# Patient Record
Sex: Male | Born: 1955 | Race: Black or African American | Hispanic: No | Marital: Single | Smoking: Current every day smoker
Health system: Southern US, Community
[De-identification: ages and names within clinical notes are randomized; demographics above are authoritative.]

## PROBLEM LIST (undated history)

## (undated) DIAGNOSIS — B20 Human immunodeficiency virus [HIV] disease: Secondary | ICD-10-CM

## (undated) DIAGNOSIS — E119 Type 2 diabetes mellitus without complications: Secondary | ICD-10-CM

## (undated) DIAGNOSIS — B1910 Unspecified viral hepatitis B without hepatic coma: Secondary | ICD-10-CM

## (undated) HISTORY — PX: APPENDECTOMY: SHX54

---

## 2000-06-19 ENCOUNTER — Encounter: Payer: Self-pay | Admitting: Emergency Medicine

## 2000-06-19 ENCOUNTER — Emergency Department (HOSPITAL_COMMUNITY): Admission: EM | Admit: 2000-06-19 | Discharge: 2000-06-19 | Payer: Self-pay | Admitting: Emergency Medicine

## 2000-07-13 ENCOUNTER — Encounter: Admission: RE | Admit: 2000-07-13 | Discharge: 2000-07-13 | Payer: Self-pay | Admitting: Internal Medicine

## 2001-07-18 ENCOUNTER — Other Ambulatory Visit: Admission: RE | Admit: 2001-07-18 | Discharge: 2001-07-18 | Payer: Self-pay | Admitting: *Deleted

## 2001-10-02 ENCOUNTER — Other Ambulatory Visit: Admission: RE | Admit: 2001-10-02 | Discharge: 2001-10-02 | Payer: Self-pay | Admitting: *Deleted

## 2002-01-04 ENCOUNTER — Emergency Department (HOSPITAL_COMMUNITY): Admission: EM | Admit: 2002-01-04 | Discharge: 2002-01-04 | Payer: Self-pay

## 2014-01-24 ENCOUNTER — Emergency Department (HOSPITAL_BASED_OUTPATIENT_CLINIC_OR_DEPARTMENT_OTHER): Payer: Self-pay

## 2014-01-24 ENCOUNTER — Emergency Department (HOSPITAL_BASED_OUTPATIENT_CLINIC_OR_DEPARTMENT_OTHER)
Admission: EM | Admit: 2014-01-24 | Discharge: 2014-01-24 | Disposition: A | Discharge status: Home / Self Care | Payer: Self-pay | Attending: Emergency Medicine | Admitting: Emergency Medicine

## 2014-01-24 ENCOUNTER — Encounter (HOSPITAL_BASED_OUTPATIENT_CLINIC_OR_DEPARTMENT_OTHER): Payer: Self-pay | Admitting: Emergency Medicine

## 2014-01-24 DIAGNOSIS — R109 Unspecified abdominal pain: Secondary | ICD-10-CM | POA: Insufficient documentation

## 2014-01-24 DIAGNOSIS — Z8619 Personal history of other infectious and parasitic diseases: Secondary | ICD-10-CM | POA: Insufficient documentation

## 2014-01-24 DIAGNOSIS — R05 Cough: Secondary | ICD-10-CM | POA: Insufficient documentation

## 2014-01-24 DIAGNOSIS — Z880 Allergy status to penicillin: Secondary | ICD-10-CM | POA: Insufficient documentation

## 2014-01-24 DIAGNOSIS — Z720 Tobacco use: Secondary | ICD-10-CM | POA: Insufficient documentation

## 2014-01-24 DIAGNOSIS — R059 Cough, unspecified: Secondary | ICD-10-CM

## 2014-01-24 DIAGNOSIS — Z21 Asymptomatic human immunodeficiency virus [HIV] infection status: Secondary | ICD-10-CM | POA: Insufficient documentation

## 2014-01-24 DIAGNOSIS — Z79899 Other long term (current) drug therapy: Secondary | ICD-10-CM | POA: Insufficient documentation

## 2014-01-24 DIAGNOSIS — E119 Type 2 diabetes mellitus without complications: Secondary | ICD-10-CM | POA: Insufficient documentation

## 2014-01-24 DIAGNOSIS — R0602 Shortness of breath: Secondary | ICD-10-CM | POA: Insufficient documentation

## 2014-01-24 DIAGNOSIS — Z7952 Long term (current) use of systemic steroids: Secondary | ICD-10-CM | POA: Insufficient documentation

## 2014-01-24 HISTORY — DX: Human immunodeficiency virus (HIV) disease: B20

## 2014-01-24 HISTORY — DX: Unspecified viral hepatitis B without hepatic coma: B19.10

## 2014-01-24 HISTORY — DX: Type 2 diabetes mellitus without complications: E11.9

## 2014-01-24 LAB — CBC WITH DIFFERENTIAL/PLATELET
Basophils Absolute: 0 10*3/uL (ref 0.0–0.1)
Basophils Relative: 0 % (ref 0–1)
EOS ABS: 0 10*3/uL (ref 0.0–0.7)
Eosinophils Relative: 1 % (ref 0–5)
HCT: 41.2 % (ref 39.0–52.0)
Hemoglobin: 14.9 g/dL (ref 13.0–17.0)
LYMPHS ABS: 2.4 10*3/uL (ref 0.7–4.0)
Lymphocytes Relative: 52 % — ABNORMAL HIGH (ref 12–46)
MCH: 30.7 pg (ref 26.0–34.0)
MCHC: 36.2 g/dL — AB (ref 30.0–36.0)
MCV: 84.8 fL (ref 78.0–100.0)
Monocytes Absolute: 0.4 10*3/uL (ref 0.1–1.0)
Monocytes Relative: 9 % (ref 3–12)
NEUTROS ABS: 1.7 10*3/uL (ref 1.7–7.7)
Neutrophils Relative %: 38 % — ABNORMAL LOW (ref 43–77)
Platelets: 122 10*3/uL — ABNORMAL LOW (ref 150–400)
RBC: 4.86 MIL/uL (ref 4.22–5.81)
RDW: 12.5 % (ref 11.5–15.5)
WBC: 4.5 10*3/uL (ref 4.0–10.5)

## 2014-01-24 LAB — URINALYSIS, ROUTINE W REFLEX MICROSCOPIC
Bilirubin Urine: NEGATIVE
GLUCOSE, UA: 100 mg/dL — AB
Ketones, ur: NEGATIVE mg/dL
Leukocytes, UA: NEGATIVE
Nitrite: NEGATIVE
Specific Gravity, Urine: 1.02 (ref 1.005–1.030)
Urobilinogen, UA: 0.2 mg/dL (ref 0.0–1.0)
pH: 5 (ref 5.0–8.0)

## 2014-01-24 LAB — COMPREHENSIVE METABOLIC PANEL
ALBUMIN: 3.5 g/dL (ref 3.5–5.2)
ALT: 61 U/L — AB (ref 0–53)
AST: 53 U/L — ABNORMAL HIGH (ref 0–37)
Alkaline Phosphatase: 69 U/L (ref 39–117)
Anion gap: 9 (ref 5–15)
BILIRUBIN TOTAL: 0.4 mg/dL (ref 0.3–1.2)
BUN: 13 mg/dL (ref 6–23)
CO2: 19 mmol/L (ref 19–32)
Calcium: 9.4 mg/dL (ref 8.4–10.5)
Chloride: 106 mEq/L (ref 96–112)
Creatinine, Ser: 0.96 mg/dL (ref 0.50–1.35)
GFR calc Af Amer: >90 mL/min (ref >90)
GLUCOSE: 180 mg/dL — AB (ref 70–99)
Potassium: 4.1 mmol/L (ref 3.5–5.1)
SODIUM: 134 mmol/L — AB (ref 135–145)
TOTAL PROTEIN: 8.7 g/dL — AB (ref 6.0–8.3)

## 2014-01-24 LAB — D-DIMER, QUANTITATIVE (NOT AT ARMC): D-Dimer, Quant: <0.27 ug/mL-FEU (ref 0.00–0.48)

## 2014-01-24 LAB — URINE MICROSCOPIC-ADD ON

## 2014-01-24 LAB — TROPONIN I: Troponin I: <0.03 ng/mL (ref <0.031)

## 2014-01-24 MED ORDER — IBUPROFEN 800 MG PO TABS: 800.0000 mg | ORAL_TABLET | Freq: Three times a day (TID) | ORAL | Status: AC | PRN

## 2014-01-24 MED ORDER — ONDANSETRON HCL 4 MG/2ML IJ SOLN
4.0000 mg | Freq: Once | INTRAMUSCULAR | Status: AC
  Administered 2014-01-24: 4 mg via INTRAVENOUS
  Filled 2014-01-24: qty 2

## 2014-01-24 MED ORDER — KETOROLAC TROMETHAMINE 30 MG/ML IJ SOLN
30.0000 mg | Freq: Once | INTRAMUSCULAR | Status: AC
  Administered 2014-01-24: 30 mg via INTRAVENOUS
  Filled 2014-01-24: qty 1

## 2014-01-24 MED ORDER — MORPHINE SULFATE 4 MG/ML IJ SOLN
4.0000 mg | Freq: Once | INTRAMUSCULAR | Status: AC
  Administered 2014-01-24: 4 mg via INTRAVENOUS
  Filled 2014-01-24: qty 1

## 2014-01-24 MED ORDER — SODIUM CHLORIDE 0.9 % IV BOLUS (SEPSIS)
1000.0000 mL | Freq: Once | INTRAVENOUS | Status: AC
  Administered 2014-01-24: 1000 mL via INTRAVENOUS

## 2014-01-24 MED ORDER — HYDROCODONE-ACETAMINOPHEN 5-325 MG PO TABS: 1.0000 | ORAL_TABLET | ORAL | Status: AC | PRN

## 2014-01-24 NOTE — ED Notes (Signed)
Pt presents to ED with complaints of right flank pain and shortness of breath  for 3 days.

## 2014-01-24 NOTE — ED Provider Notes (Signed)
TIME SEEN: 1:00 PM  CHIEF COMPLAINT: Right flank pain, shortness of breath  HPI: Pt is a 59 y.o. 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 He is also had some mild shortness of breath. He has had a chronic dry cough that is unchanged. No chest pain. No injury to the back. No numbness, tingling or focal weakness. No bowel or bladder incontinence. No history of kidney stone. No dysuria or hematuria. No prior history of PE or DVT. No fever. No abdominal pain. No vomiting or diarrhea.  ROS: See HPI Constitutional: no fever  Eyes: no drainage  ENT: no runny nose   Cardiovascular:  no chest pain  Resp: no SOB  GI: no vomiting GU: no dysuria Integumentary: no rash  Allergy: no hives  Musculoskeletal: no leg swelling  Neurological: no slurred speech ROS otherwise negative  PAST MEDICAL HISTORY/PAST SURGICAL HISTORY:  Past Medical History  Diagnosis Date  . HIV disease   . Diabetes mellitus without complication   . Hepatitis B     MEDICATIONS:  Prior to Admission medications   Medication Sig Start Date End Date Taking? Authorizing Provider  atazanavir (REYATAZ) 100 MG capsule Take 100 mg by mouth daily with breakfast.   Yes Historical Provider, MD  desonide (DESOWEN) 0.05 % cream Apply topically 2 (two) times daily.   Yes Historical Provider, MD  emtricitabine (EMTRIVA) 200 MG capsule Take 200 mg by mouth daily.   Yes Historical Provider, MD  lamoTRIgine (LAMICTAL) 100 MG tablet Take 100 mg by mouth daily.   Yes Historical Provider, MD  prazosin (MINIPRESS) 1 MG capsule Take 1 mg by mouth at bedtime.   Yes Historical Provider, MD  ritonavir (NORVIR) 100 MG capsule Take by mouth daily with breakfast.   Yes Historical Provider, MD  topiramate (TOPAMAX) 100 MG tablet Take 100 mg by mouth 2 (two) times daily.   Yes  Historical Provider, MD  traZODone (DESYREL) 150 MG tablet Take by mouth at bedtime.   Yes Historical Provider, MD  Vitamin D, Ergocalciferol, (DRISDOL) 50000 UNITS CAPS capsule Take 50,000 Units by mouth every 7 (seven) days.   Yes Historical Provider, MD    ALLERGIES:  Allergies  Allergen Reactions  . Codeine   . Erythromycin   . Penicillins     SOCIAL HISTORY:  History  Substance Use Topics  . Smoking status: Current Every Day Smoker  . Smokeless tobacco: Not on file  . Alcohol Use: Not on file    FAMILY HISTORY: No family history on file.  EXAM: BP 136/76 mmHg  Pulse 85  Temp(Src) 98.2 F (36.8 C) (Oral)  Resp 20  Ht 5\' 8"  (1.727 m)  Wt 224 lb (101.606 kg)  BMI 34.07 kg/m2  SpO2 100% CONSTITUTIONAL: Alert and oriented and responds appropriately to questions. Well-appearing; well-nourished HEAD: Normocephalic EYES: Conjunctivae clear, PERRL ENT: normal nose; no rhinorrhea; moist mucous membranes; pharynx without lesions noted NECK: Supple, no meningismus, no LAD  CARD: RRR; S1 and S2 appreciated; no murmurs, no clicks, no rubs, no gallops RESP: Normal chest excursion without splinting or tachypnea; breath sounds clear and equal bilaterally; no wheezes, no rhonchi, no rales, no hypoxia ABD/GI: Normal bowel sounds; non-distended; soft, non-tender, no rebound, no guarding BACK:  The back appears normal and is tender to palpation over the right flank with just minimal palpation,  there are no lesions noted over this area EXT: Normal ROM in all joints; non-tender to palpation; no edema; normal capillary refill; no cyanosis    SKIN: Normal color for age and race; warm NEURO: Moves all extremities equally, sensation to light touch intact diffusely, cranial nerves II through XII intact, normal gait PSYCH: The patient's mood and manner are appropriate. Grooming and personal hygiene are appropriate.  MEDICAL DECISION MAKING: Patient here with right flank pain that may be  musculoskeletal in nature. Differential diagnosis also includes kidney stone, pyelonephritis, pulmonary embolus, less likely cholecystitis or cholelithiasis. We'll give pain medication, obtain labs, chest x-ray, urinalysis.  ED PROGRESS: Patient's labs are unremarkable. Very mild elevation of his AST and ALTs but on repeat exam he has no right upper quadrant tenderness, negative Murphy sign. Denies that pain changes with food. D-dimer negative. Troponin negative. Chest x-ray clear. He does have trace hematuria and proteinuria and his urine but his blood pressure is normally 136/76. Given this trace hematuria and right flank pain concern for possible kidney stone. We'll obtain a renal colic CT.   CT scan shows no acute intra-abdominal or pelvic pathologies. He does have a small right inguinal hernia that contains fat. Reports his pain is improved. Suspect this is musculoskeletal pain. We'll discharge with pain medication. He is here visiting his sister from [place]. Have advised him to follow-up with his primary care provider when he returns in February if symptoms continue. Discussed return precautions. He verbalizes understanding and is comfortable with plan.        EKG Interpretation  Date/Time:  Saturday January 24 2014 13:07:07 EST Ventricular Rate:  77 PR Interval:  174 QRS Duration: 86 QT Interval:  376 QTC Calculation: 425 R Axis:   7 Text Interpretation:  Normal sinus rhythm Normal ECG Confirmed by Brayan Votaw,  DO, Greg Eckrich ([id number]) on 01/24/2014 1:32:17 PM          Layla Maw Marilena Trevathan, DO 01/24/14 [id number]

## 2014-01-24 NOTE — Discharge Instructions (Signed)
Flank Pain °Flank pain refers to pain that is located on the side of the body between the upper abdomen and the back. The pain may occur over a short period of time (acute) or may be long-term or reoccurring (chronic). It may be mild or severe. Flank pain can be caused by many things. °CAUSES  °Some of the more common causes of flank pain include: °· Muscle strains.   °· Muscle spasms.   °· A disease of your spine (vertebral disk disease).   °· A lung infection (pneumonia).   °· Fluid around your lungs (pulmonary edema).   °· A kidney infection.   °· Kidney stones.   °· A very painful skin rash caused by the chickenpox virus (shingles).   °· Gallbladder disease.   °HOME CARE INSTRUCTIONS  °Home care will depend on the cause of your pain. In general, °· Rest as directed by your caregiver. °· Drink enough fluids to keep your urine clear or pale yellow. °· Only take over-the-counter or prescription medicines as directed by your caregiver. Some medicines may help relieve the pain. °· Tell your caregiver about any changes in your pain. °· Follow up with your caregiver as directed. °SEEK IMMEDIATE MEDICAL CARE IF:  °· Your pain is not controlled with medicine.   °· You have new or worsening symptoms. °· Your pain increases.   °· You have abdominal pain.   °· You have shortness of breath.   °· You have persistent nausea or vomiting.   °· You have swelling in your abdomen.   °· You feel faint or pass out.   °· You have blood in your urine. °· You have a fever or persistent symptoms for more than 2-3 days. °· You have a fever and your symptoms suddenly get worse. °MAKE SURE YOU:  °· Understand these instructions. °· Will watch your condition. °· Will get help right away if you are not doing well or get worse. °Document Released: 02/16/2005 Document Revised: 09/20/2011 Document Reviewed: 08/10/2011 °[employer]® Patient Information ©2015 [employer]. This information is not intended to replace advice given to you by your  health care provider. Make sure you discuss any questions you have with your health care provider. ° °Thoracic Strain °You have injured the muscles or tendons that attach to the upper part of your back behind your chest. This injury is called a thoracic strain, thoracic sprain, or mid-back strain.  °CAUSES  °The cause of thoracic strain varies. A less severe injury involves pulling a muscle or tendon without tearing it. A more severe injury involves tearing (rupturing) a muscle or tendon. With less severe injuries, there may be little loss of strength. Sometimes, there are breaks (fractures) in the bones to which the muscles are attached. These fractures are rare, unless there was a direct hit (trauma) or you have weak bones due to osteoporosis or age. Longstanding strains may be caused by overuse or improper form during certain movements. Obesity can also increase your risk for back injuries. Sudden strains may occur due to injury or not warming up properly before exercise. Often, there is no obvious cause for a thoracic strain. °SYMPTOMS  °The main symptom is pain, especially with movement, such as during exercise. °DIAGNOSIS  °Your caregiver can usually tell what is wrong by taking an X-ray and doing a physical exam. °TREATMENT  °· Physical therapy may be helpful for recovery. Your caregiver can give you exercises to do or refer you to a physical therapist after your pain improves. °· After your pain improves, strengthening and conditioning programs appropriate for your sport or   occupation may be helpful. °· Always warm up before physical activities or athletics. Stretching after physical activity may also help. °· Certain over-the-counter medicines may also help. Ask your caregiver if there are medicines that would help you. °If this is your first thoracic strain injury, proper care and proper healing time before starting activities should prevent long-term problems. Torn ligaments and tendons require as long to  heal as broken bones. Average healing times may be only 1 week for a mild strain. For torn muscles and tendons, healing time may be up to 6 weeks to 2 months. °HOME CARE INSTRUCTIONS  °· Apply ice to the injured area. Ice massages may also be used as directed. °¨ Put ice in a plastic bag. °¨ Place a towel between your skin and the bag. °¨ Leave the ice on for 15-20 minutes, 03-04 times a day, for the first 2 days. °· Only take over-the-counter or prescription medicines for pain, discomfort, or fever as directed by your caregiver. °· Keep your appointments for physical therapy if this was prescribed. °· Use wraps and back braces as instructed. °SEEK IMMEDIATE MEDICAL CARE IF:  °· You have an increase in bruising, swelling, or pain. °· Your pain has not improved with medicines. °· You develop new shortness of breath, chest pain, or fever. °· Problems seem to be getting worse rather than better. °MAKE SURE YOU:  °· Understand these instructions. °· Will watch your condition. °· Will get help right away if you are not doing well or get worse. °Document Released: 03/18/2003 Document Revised: 03/20/2011 Document Reviewed: 02/11/2010 °[employer]® Patient Information ©2015 [employer]. This information is not intended to replace advice given to you by your health care provider. Make sure you discuss any questions you have with your health care provider. ° °

## 2016-08-01 IMAGING — CT CT RENAL STONE PROTOCOL
2 of 7 series · 15 of 46 positions shown, 19 images · non-contrast
Comparison: None

CLINICAL DATA: RIGHT flank pain for 4 days, HIV, diabetes,
hepatitis-B, smoker

EXAM:
CT ABDOMEN AND PELVIS WITHOUT CONTRAST
TECHNIQUE: Multidetector CT imaging of the abdomen and pelvis was performed
following the standard protocol without IV contrast. Sagittal and
coronal MPR images reconstructed from axial data set. Motion
artifacts in pelvis from coughing, for which repeat imaging was
performed.

[Series 2: renal stone > 200 lbs 5.0 b31f · axial · 0.84mm/px · z∈[-494,-59]mm · 12 of 97 slices shown, 16 images]
[im 5/97  soft-tissue]
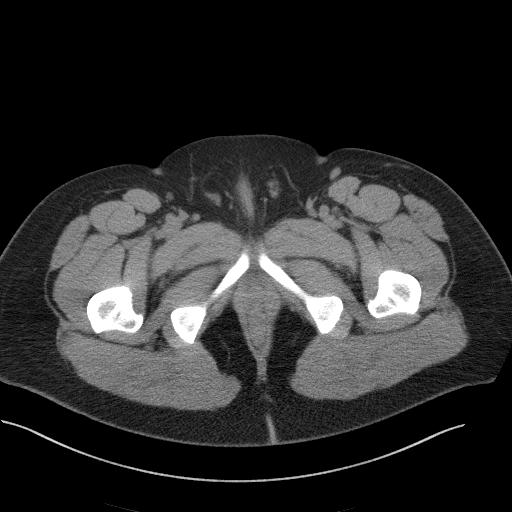
[im 5/97  bone]
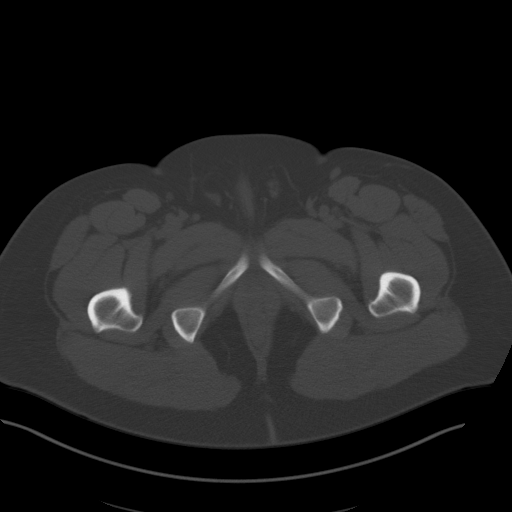
[im 15/97  soft-tissue]
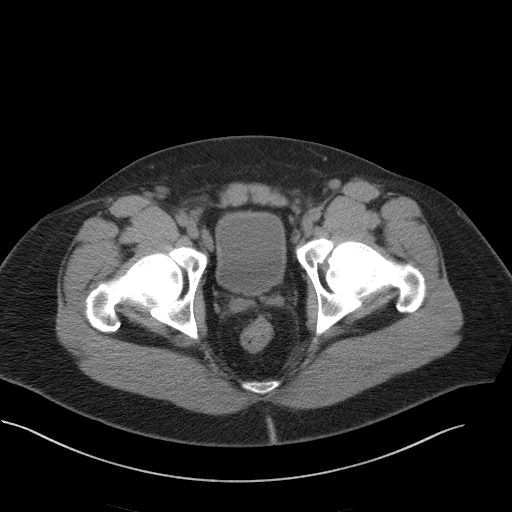
[im 25/97  soft-tissue]
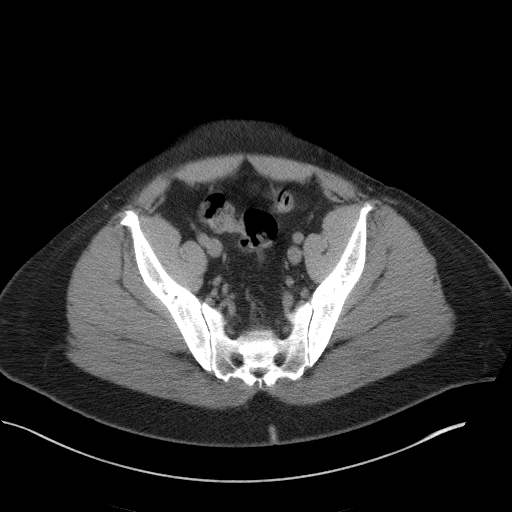
[im 34/97  soft-tissue]
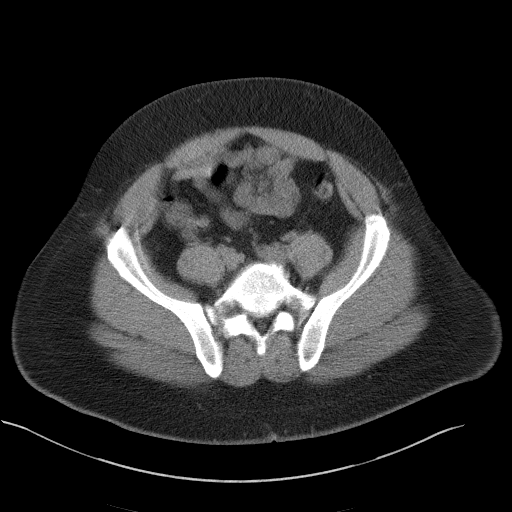
[im 44/97  soft-tissue]
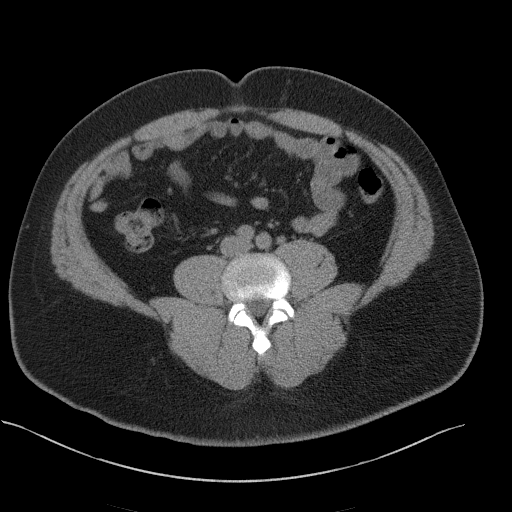
[im 53/97  soft-tissue]
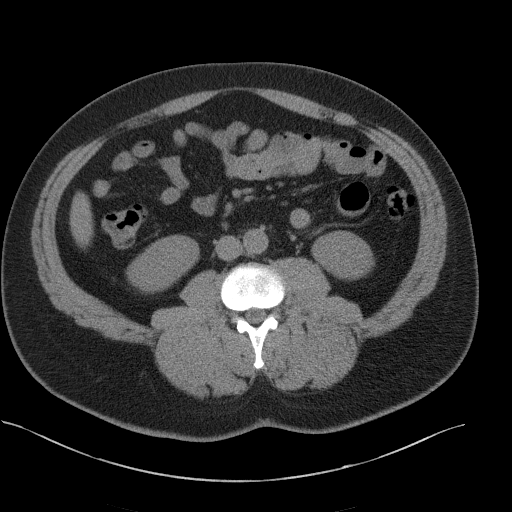
[im 63/97  soft-tissue]
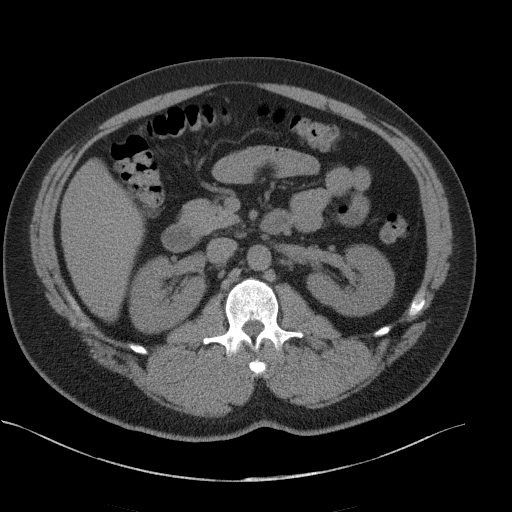
[im 73/97  soft-tissue]
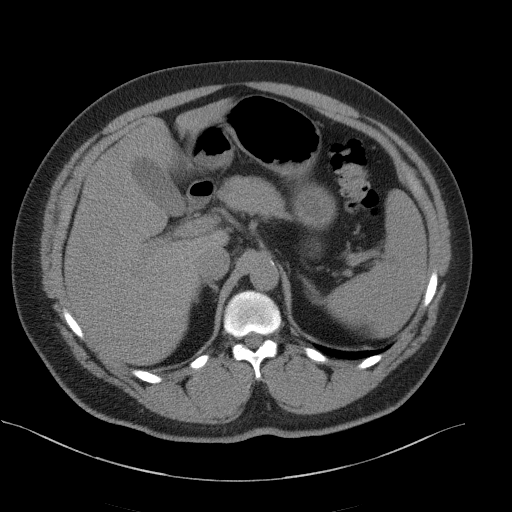
[im 77/97  lung]
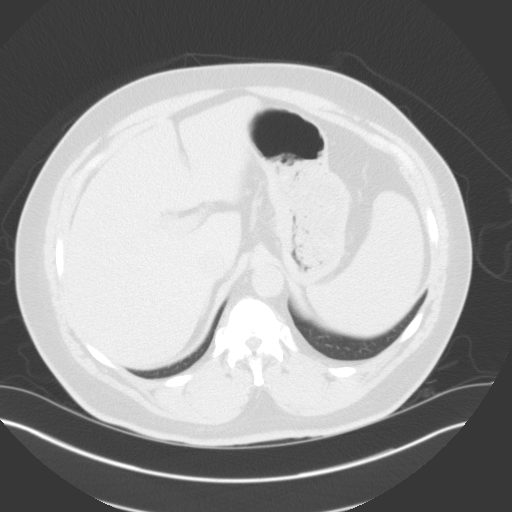
[im 82/97  soft-tissue]
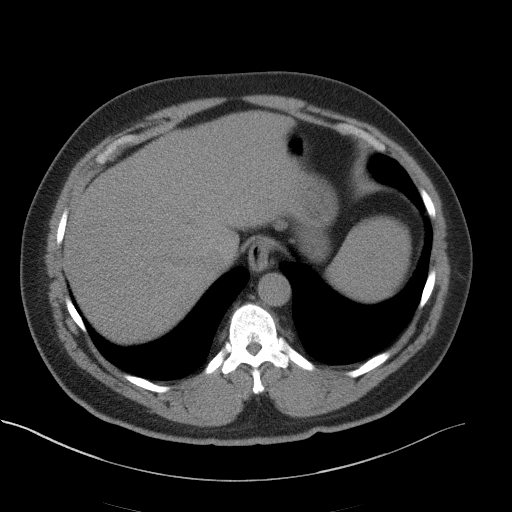
[im 82/97  lung]
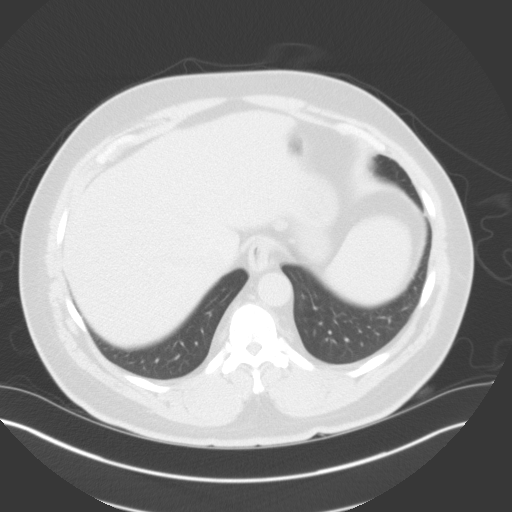
[im 82/97  bone]
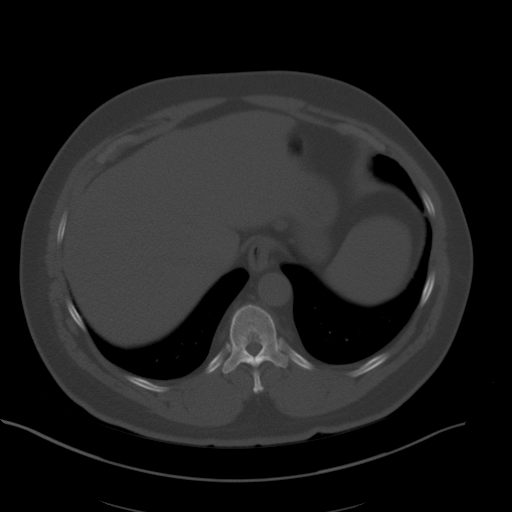
[im 87/97  lung]
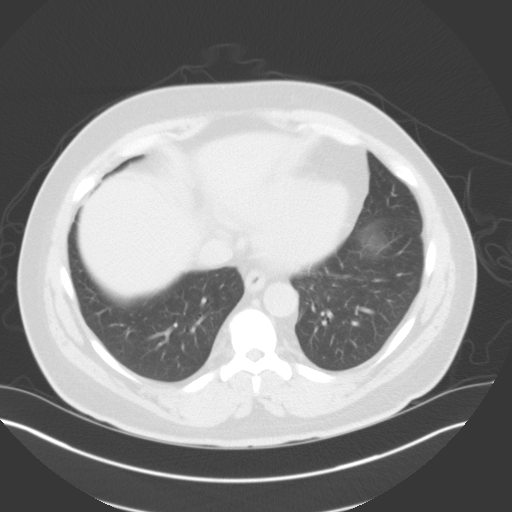
[im 92/97  soft-tissue]
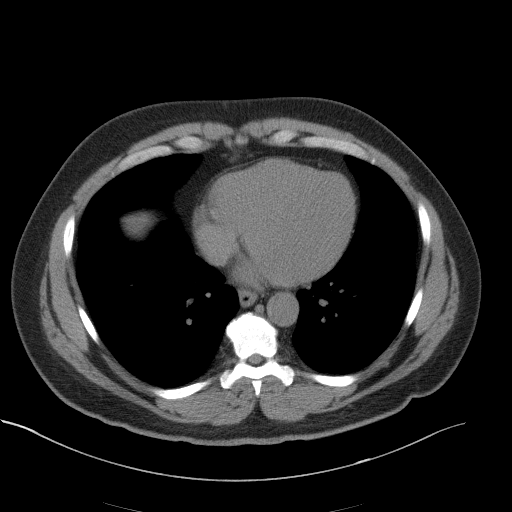
[im 92/97  lung]
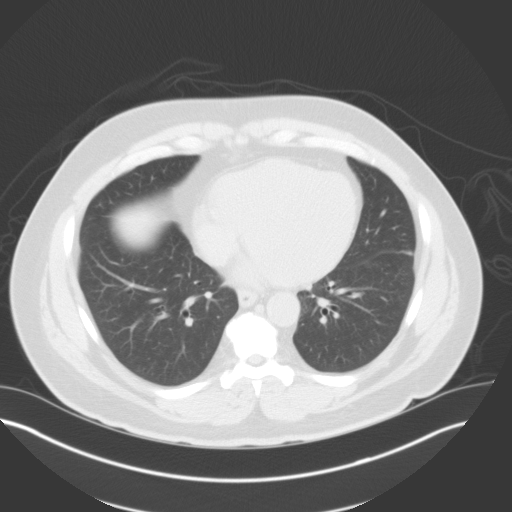

[Series 5: renal stone 3.0 coronal · coronal · 0.86mm/px · 3 of 105 slices shown]
[im 21/105  soft-tissue]
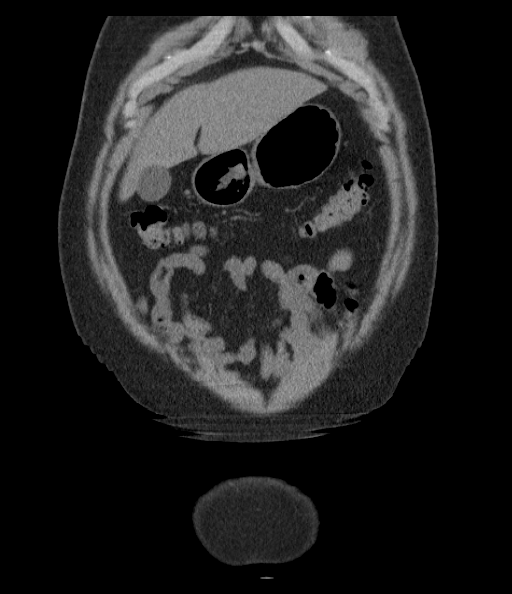
[im 42/105  soft-tissue]
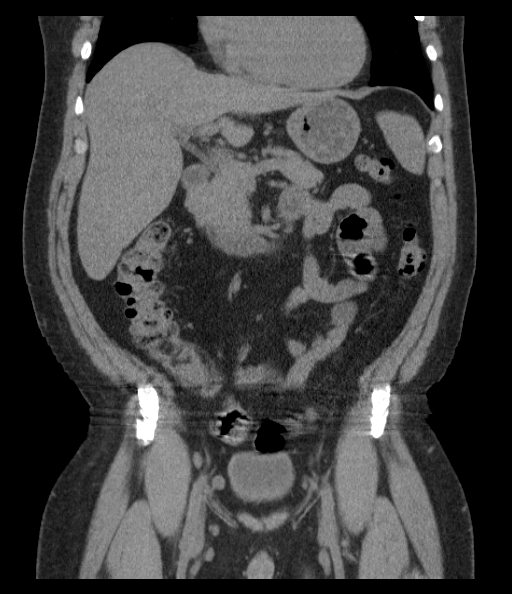
[im 63/105  soft-tissue]
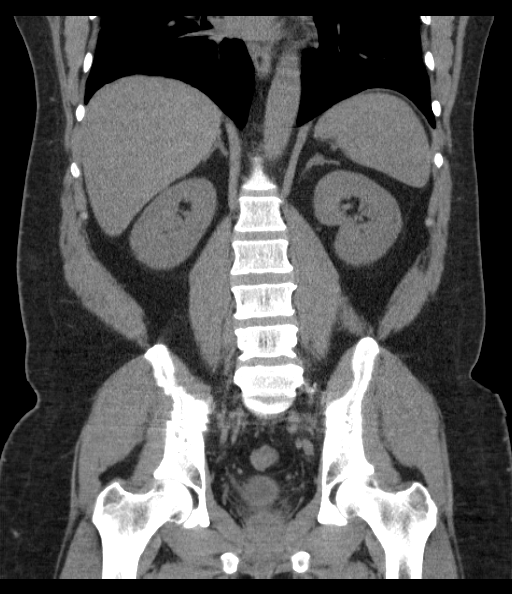

[15 of 46 positions shown; findings below may reference images not displayed]

FINDINGS: Lung bases clear.

No ureter calcification, hydronephrosis, or ureteral dilatation.

Within limits of a nonenhanced exam no focal abnormalities of the
liver, spleen, pancreas, kidneys, or adrenal glands.

Small RIGHT inguinal hernia containing fat.

Appendix surgically absent by history.

Stomach and bowel loops unremarkable for technique.

Multiple normal and upper normal sized pelvic lymph nodes
bilaterally.

No mass, adenopathy, free fluid or inflammatory process.

No acute osseous findings.
IMPRESSION: No acute intra-abdominal or intrapelvic abnormalities.

Small RIGHT inguinal hernia containing fat.

Normal and upper normal sized pelvic lymph nodes, none
pathologically enlarged.
